# Patient Record
Sex: Female | Born: 1975 | Race: White | Hispanic: No | Marital: Married | Smoking: Never smoker
Health system: Southern US, Community
[De-identification: ages and names within clinical notes are randomized; demographics above are authoritative.]

## PROBLEM LIST (undated history)

## (undated) DIAGNOSIS — F32A Depression, unspecified: Secondary | ICD-10-CM

---

## 2001-03-19 HISTORY — PX: BREAST BIOPSY: SHX20

## 2015-08-09 DIAGNOSIS — L570 Actinic keratosis: Secondary | ICD-10-CM | POA: Diagnosis not present

## 2015-08-24 DIAGNOSIS — Z6826 Body mass index (BMI) 26.0-26.9, adult: Secondary | ICD-10-CM | POA: Diagnosis not present

## 2015-08-24 DIAGNOSIS — F321 Major depressive disorder, single episode, moderate: Secondary | ICD-10-CM | POA: Diagnosis not present

## 2015-08-24 DIAGNOSIS — R5383 Other fatigue: Secondary | ICD-10-CM | POA: Diagnosis not present

## 2015-08-24 DIAGNOSIS — Z1389 Encounter for screening for other disorder: Secondary | ICD-10-CM | POA: Diagnosis not present

## 2015-09-30 DIAGNOSIS — R002 Palpitations: Secondary | ICD-10-CM | POA: Diagnosis not present

## 2015-09-30 DIAGNOSIS — Z6825 Body mass index (BMI) 25.0-25.9, adult: Secondary | ICD-10-CM | POA: Diagnosis not present

## 2015-09-30 DIAGNOSIS — M545 Low back pain: Secondary | ICD-10-CM | POA: Diagnosis not present

## 2015-09-30 DIAGNOSIS — R319 Hematuria, unspecified: Secondary | ICD-10-CM | POA: Diagnosis not present

## 2015-10-10 DIAGNOSIS — D485 Neoplasm of uncertain behavior of skin: Secondary | ICD-10-CM | POA: Diagnosis not present

## 2015-10-10 DIAGNOSIS — D225 Melanocytic nevi of trunk: Secondary | ICD-10-CM | POA: Diagnosis not present

## 2016-01-10 DIAGNOSIS — Z6826 Body mass index (BMI) 26.0-26.9, adult: Secondary | ICD-10-CM | POA: Diagnosis not present

## 2016-01-10 DIAGNOSIS — F321 Major depressive disorder, single episode, moderate: Secondary | ICD-10-CM | POA: Diagnosis not present

## 2016-01-10 DIAGNOSIS — R5383 Other fatigue: Secondary | ICD-10-CM | POA: Diagnosis not present

## 2016-04-11 DIAGNOSIS — H6983 Other specified disorders of Eustachian tube, bilateral: Secondary | ICD-10-CM | POA: Diagnosis not present

## 2016-04-11 DIAGNOSIS — R5383 Other fatigue: Secondary | ICD-10-CM | POA: Diagnosis not present

## 2016-04-11 DIAGNOSIS — F321 Major depressive disorder, single episode, moderate: Secondary | ICD-10-CM | POA: Diagnosis not present

## 2016-04-11 DIAGNOSIS — Z6826 Body mass index (BMI) 26.0-26.9, adult: Secondary | ICD-10-CM | POA: Diagnosis not present

## 2016-04-11 DIAGNOSIS — Z01419 Encounter for gynecological examination (general) (routine) without abnormal findings: Secondary | ICD-10-CM | POA: Diagnosis not present

## 2017-01-17 DIAGNOSIS — R002 Palpitations: Secondary | ICD-10-CM | POA: Diagnosis not present

## 2017-01-17 DIAGNOSIS — R600 Localized edema: Secondary | ICD-10-CM | POA: Diagnosis not present

## 2017-01-17 DIAGNOSIS — Z6825 Body mass index (BMI) 25.0-25.9, adult: Secondary | ICD-10-CM | POA: Diagnosis not present

## 2017-01-17 DIAGNOSIS — R0600 Dyspnea, unspecified: Secondary | ICD-10-CM | POA: Diagnosis not present

## 2017-10-23 DIAGNOSIS — A609 Anogenital herpesviral infection, unspecified: Secondary | ICD-10-CM | POA: Diagnosis not present

## 2018-06-12 DIAGNOSIS — A6009 Herpesviral infection of other urogenital tract: Secondary | ICD-10-CM | POA: Diagnosis not present

## 2019-01-10 DIAGNOSIS — Z1159 Encounter for screening for other viral diseases: Secondary | ICD-10-CM | POA: Diagnosis not present

## 2019-03-23 DIAGNOSIS — F3289 Other specified depressive episodes: Secondary | ICD-10-CM | POA: Diagnosis not present

## 2019-03-23 DIAGNOSIS — F338 Other recurrent depressive disorders: Secondary | ICD-10-CM | POA: Diagnosis not present

## 2019-03-23 DIAGNOSIS — F411 Generalized anxiety disorder: Secondary | ICD-10-CM | POA: Diagnosis not present

## 2019-04-10 DIAGNOSIS — F411 Generalized anxiety disorder: Secondary | ICD-10-CM | POA: Diagnosis not present

## 2019-04-10 DIAGNOSIS — U071 COVID-19: Secondary | ICD-10-CM | POA: Diagnosis not present

## 2019-04-10 DIAGNOSIS — F338 Other recurrent depressive disorders: Secondary | ICD-10-CM | POA: Diagnosis not present

## 2019-04-29 DIAGNOSIS — F331 Major depressive disorder, recurrent, moderate: Secondary | ICD-10-CM | POA: Diagnosis not present

## 2019-04-29 DIAGNOSIS — Z6825 Body mass index (BMI) 25.0-25.9, adult: Secondary | ICD-10-CM | POA: Diagnosis not present

## 2019-04-29 DIAGNOSIS — R5383 Other fatigue: Secondary | ICD-10-CM | POA: Diagnosis not present

## 2019-07-09 DIAGNOSIS — R21 Rash and other nonspecific skin eruption: Secondary | ICD-10-CM | POA: Diagnosis not present

## 2019-08-07 DIAGNOSIS — R5383 Other fatigue: Secondary | ICD-10-CM | POA: Diagnosis not present

## 2019-08-07 DIAGNOSIS — F331 Major depressive disorder, recurrent, moderate: Secondary | ICD-10-CM | POA: Diagnosis not present

## 2019-08-07 DIAGNOSIS — Z6823 Body mass index (BMI) 23.0-23.9, adult: Secondary | ICD-10-CM | POA: Diagnosis not present

## 2020-06-10 ENCOUNTER — Other Ambulatory Visit: Payer: Self-pay

## 2020-06-10 ENCOUNTER — Emergency Department (HOSPITAL_COMMUNITY)
Admission: EM | Admit: 2020-06-10 | Discharge: 2020-06-10 | Disposition: A | Discharge status: Home / Self Care | Payer: BC Managed Care – PPO | Attending: Emergency Medicine | Admitting: Emergency Medicine

## 2020-06-10 ENCOUNTER — Encounter (HOSPITAL_COMMUNITY): Payer: Self-pay | Admitting: Emergency Medicine

## 2020-06-10 DIAGNOSIS — F430 Acute stress reaction: Secondary | ICD-10-CM | POA: Insufficient documentation

## 2020-06-10 HISTORY — DX: Depression, unspecified: F32.A

## 2020-06-10 MED ORDER — LORAZEPAM 1 MG PO TABS
1.0000 mg | ORAL_TABLET | Freq: Once | ORAL | Status: AC
  Administered 2020-06-10: 1 mg via ORAL
  Filled 2020-06-10: qty 1

## 2020-06-10 NOTE — Discharge Instructions (Signed)
We determined you were not an imminent threat to yourself or others  We came up with a plan to follow up with your primary care doctor in 48 hours to obtain a referral or assistance establishing care with a psychiatrist or a therapist as soon as possible.    Please take 300 mg wellbutrin daily, do not change or modify your dose unless directed by a clinician  Return immediately to the ER if you develop increased anxiety, depression, thoughts of wanting to die or plans to hurt yourself or others

## 2020-06-10 NOTE — ED Notes (Signed)
Pt uncooperative at this time. Security at bedside.

## 2020-06-10 NOTE — ED Triage Notes (Signed)
Pt here for a anxiety attack. Pt states she recently cut her wellbutrin in half because her boyfriend wanted her to not take medication. Pt states she also has stressors d/t her son being in the army and her second son is wanting to join the [employer]. Pt tearful in triage. States she sometimes thinks of killing her self and that if she did do it, it would be quick so that she would not suffer. She also states that she would never go through with it because she loves her children so much and it would hurt them.
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
## 2020-06-21 ENCOUNTER — Other Ambulatory Visit (HOSPITAL_COMMUNITY): Payer: Self-pay | Admitting: General Practice

## 2020-06-21 DIAGNOSIS — Z1231 Encounter for screening mammogram for malignant neoplasm of breast: Secondary | ICD-10-CM

## 2020-07-01 ENCOUNTER — Ambulatory Visit (HOSPITAL_COMMUNITY): Payer: BC Managed Care – PPO

## 2020-07-15 ENCOUNTER — Encounter (HOSPITAL_COMMUNITY): Payer: Self-pay

## 2020-07-15 ENCOUNTER — Ambulatory Visit (HOSPITAL_COMMUNITY)
Admission: RE | Admit: 2020-07-15 | Discharge: 2020-07-15 | Disposition: A | Discharge status: Home / Self Care | Payer: BC Managed Care – PPO | Source: Ambulatory Visit | Attending: General Practice | Admitting: General Practice

## 2020-07-15 ENCOUNTER — Other Ambulatory Visit: Payer: Self-pay

## 2020-07-15 DIAGNOSIS — Z1231 Encounter for screening mammogram for malignant neoplasm of breast: Secondary | ICD-10-CM | POA: Insufficient documentation

## 2020-07-19 ENCOUNTER — Other Ambulatory Visit (HOSPITAL_COMMUNITY): Payer: Self-pay | Admitting: General Practice

## 2020-07-20 ENCOUNTER — Other Ambulatory Visit (HOSPITAL_COMMUNITY): Payer: Self-pay | Admitting: General Practice

## 2020-07-25 ENCOUNTER — Other Ambulatory Visit (HOSPITAL_COMMUNITY): Payer: Self-pay | Admitting: General Practice

## 2020-07-25 DIAGNOSIS — R928 Other abnormal and inconclusive findings on diagnostic imaging of breast: Secondary | ICD-10-CM

## 2020-08-09 ENCOUNTER — Other Ambulatory Visit: Payer: Self-pay

## 2020-08-09 ENCOUNTER — Ambulatory Visit (HOSPITAL_COMMUNITY)
Admission: RE | Admit: 2020-08-09 | Discharge: 2020-08-09 | Disposition: A | Discharge status: Home / Self Care | Payer: BC Managed Care – PPO | Source: Ambulatory Visit | Attending: General Practice | Admitting: General Practice

## 2020-08-09 DIAGNOSIS — R928 Other abnormal and inconclusive findings on diagnostic imaging of breast: Secondary | ICD-10-CM

## 2021-06-24 IMAGING — MG MM DIGITAL DIAGNOSTIC UNILAT*R* W/ TOMO W/ CAD
4 series · 4 of 12 positions shown · non-contrast
Comparison: Screening mammogram dated 07/15/2020.

CLINICAL DATA: Screening recall for possible right breast mass. The
patient states she has had a benign biopsy of a palpable right
breast mass many years ago.

EXAM:
DIGITAL DIAGNOSTIC UNILATERAL RIGHT MAMMOGRAM WITH TOMOSYNTHESIS AND
CAD; ULTRASOUND RIGHT BREAST LIMITED
TECHNIQUE: Right digital diagnostic mammography and breast tomosynthesis was
performed. The images were evaluated with computer-aided detection.;
Targeted ultrasound examination of the right breast was performed

[R CC synth-2D]
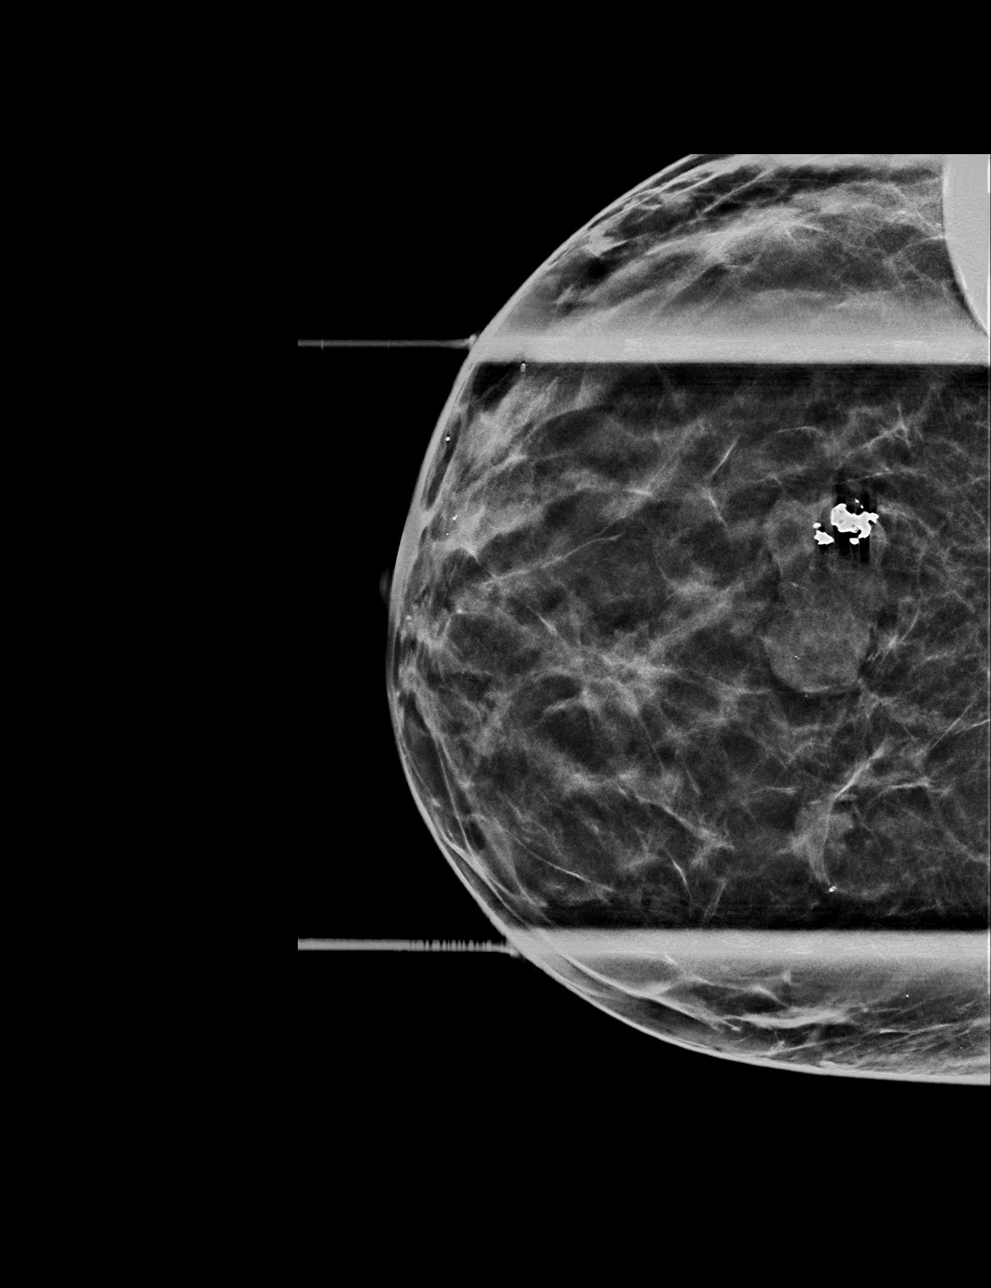

[R MLO synth-2D]
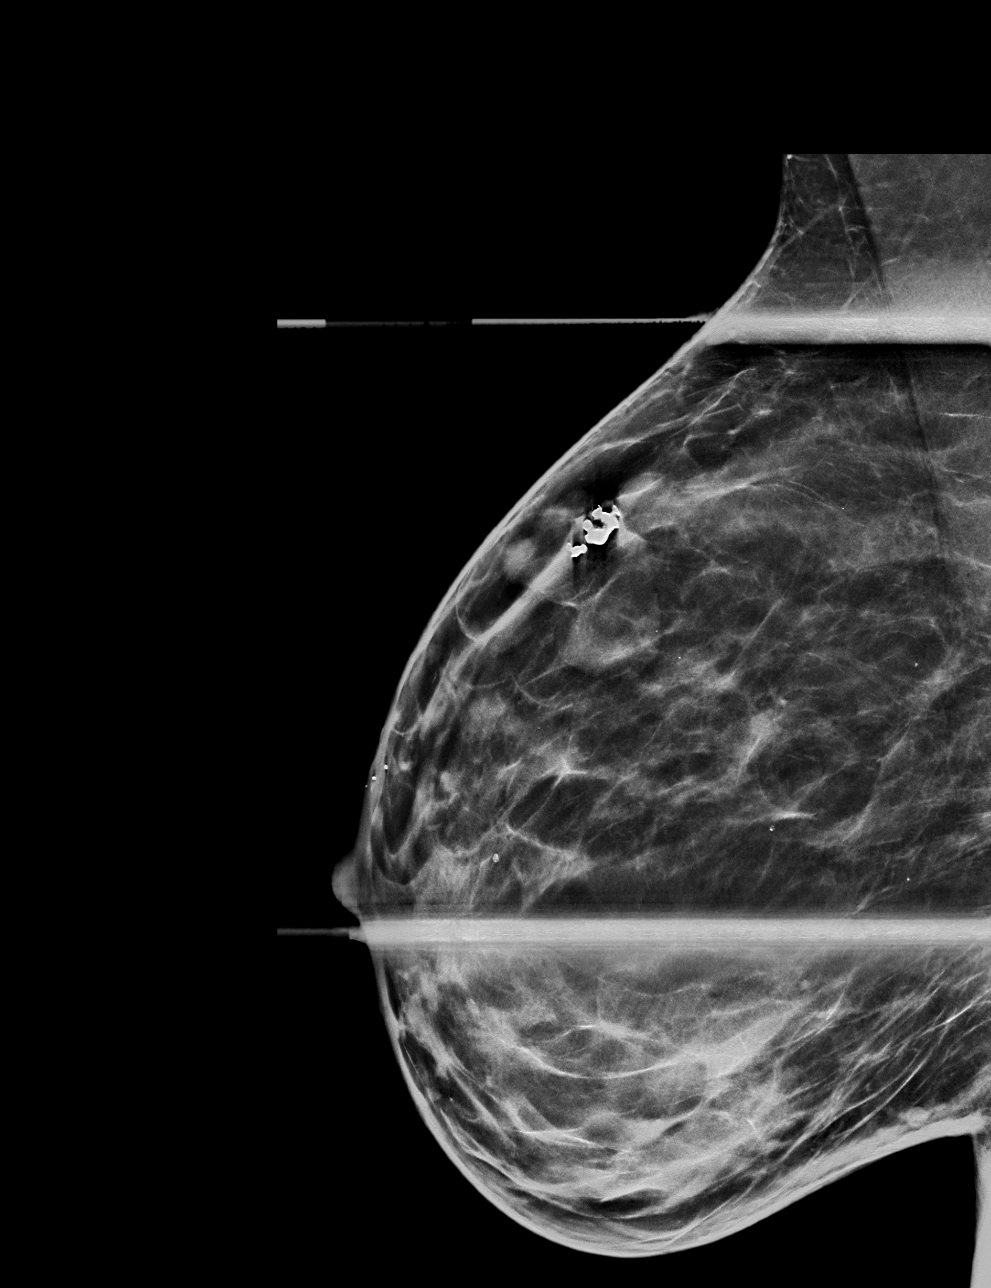

[R CC tomo · tomo slice 23/46.0]
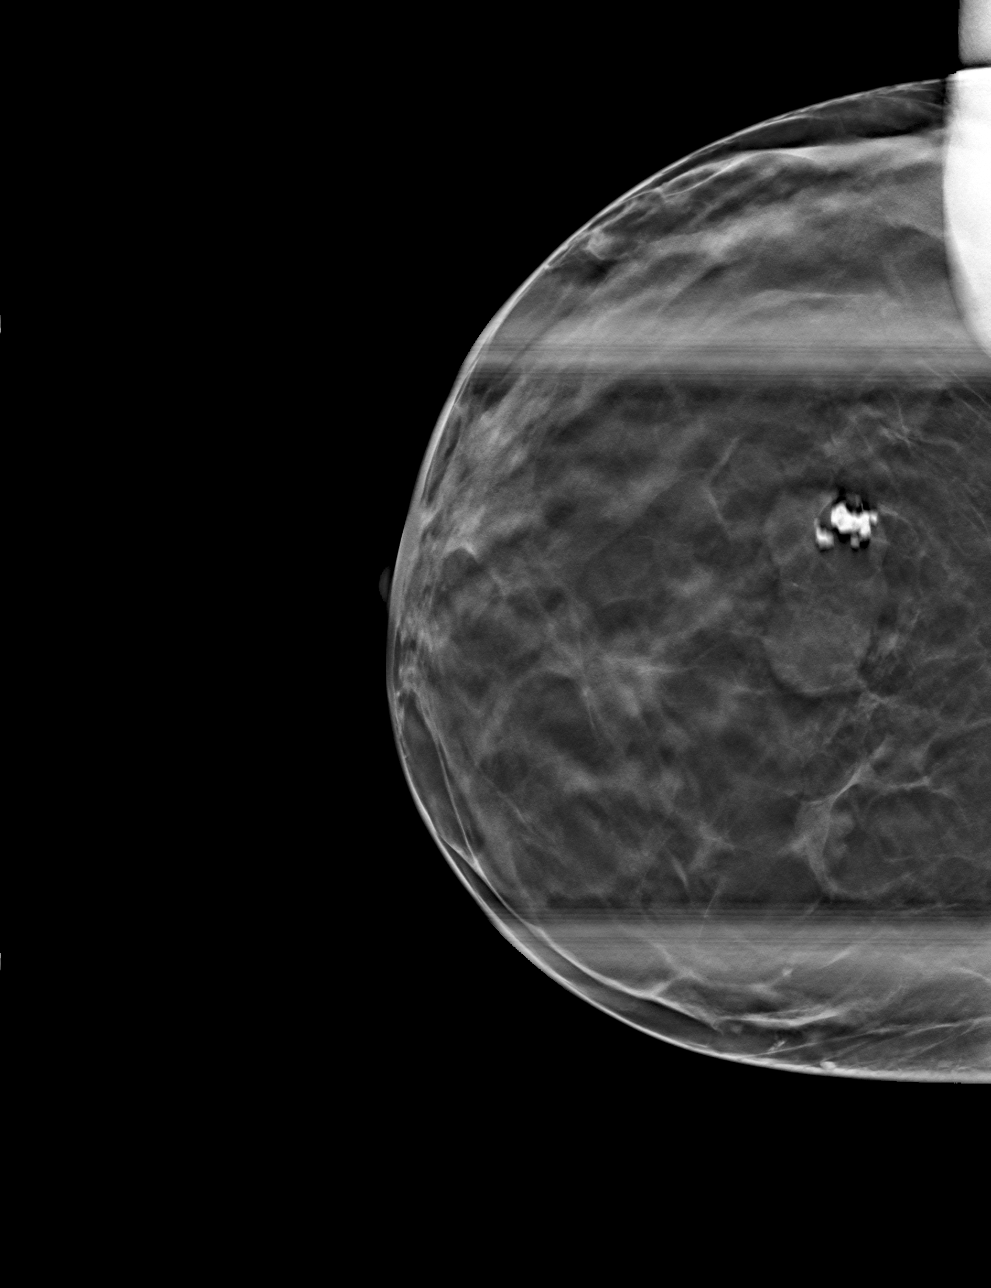

[R MLO tomo · tomo slice 27/53.0]
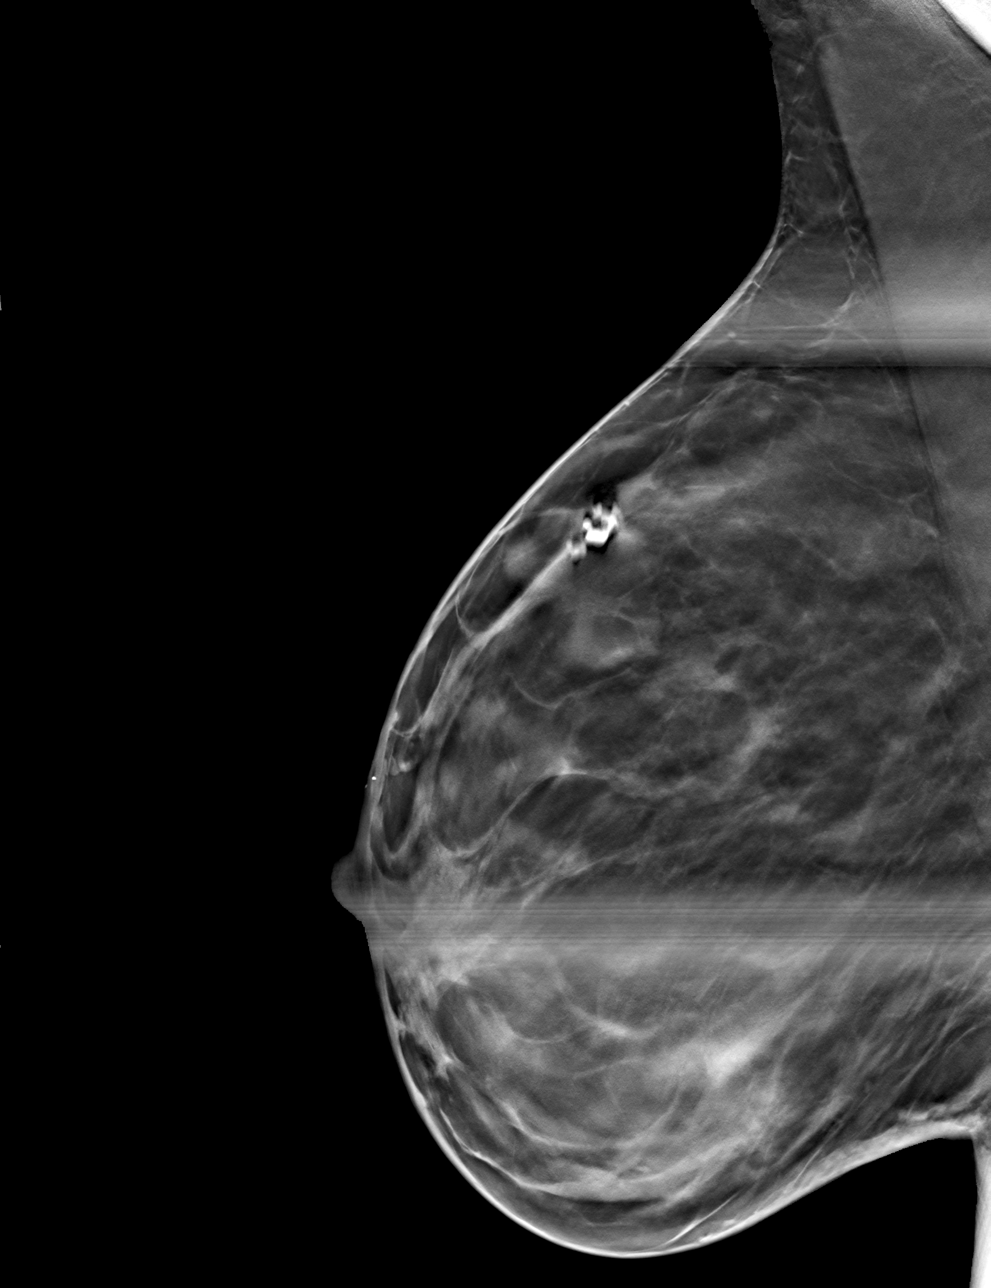

[4 of 12 positions shown; findings below may reference images not displayed]

ACR Breast Density Category b: There are scattered areas of
fibroglandular density.
FINDINGS: Spot compression tomograms were performed over the upper inner right
breast. There is an oval mass with obscured margins in the upper
inner right breast measuring approximately 1.2 cm. There is also an
oval circumscribed mass in the upper central right breast
superficial depth containing coarse popcorn type calcifications
measuring 3.4 cm, most consistent with an involuting fibroadenoma.

Targeted ultrasound of the right breast was performed. There is an
oval circumscribed hypoechoic mass in the right breast at 1 o'clock
1 cm from the nipple measuring 3.2 x 0.9 x 1.9 cm. This corresponds
well with the larger mass containing popcorn calcifications in the
upper central anterior right breast. There is an oval circumscribed
hypoechoic mass in the right breast at 2 o'clock 4 cm from nipple
measuring 1 x 0.5 x 1 cm. This corresponds well with the smaller
oval mass in the upper inner right breast measuring 1.2 cm and
demonstrates imaging features most suggestive of a fibroadenoma.
IMPRESSION: Probably benign mass in the right breast at the 2 o'clock position.

RECOMMENDATION:
Right breast ultrasound in 6 months.

I have discussed the findings and recommendations with the patient.
If applicable, a reminder letter will be sent to the patient
regarding the next appointment.

BI-RADS CATEGORY  3: Probably benign.

## 2021-06-24 IMAGING — US US BREAST*R* LIMITED INC AXILLA
1 series · 13 of 18 positions shown · non-contrast
Comparison: Screening mammogram dated 07/15/2020.

CLINICAL DATA: Screening recall for possible right breast mass. The
patient states she has had a benign biopsy of a palpable right
breast mass many years ago.

EXAM:
DIGITAL DIAGNOSTIC UNILATERAL RIGHT MAMMOGRAM WITH TOMOSYNTHESIS AND
CAD; ULTRASOUND RIGHT BREAST LIMITED
TECHNIQUE: Right digital diagnostic mammography and breast tomosynthesis was
performed. The images were evaluated with computer-aided detection.;
Targeted ultrasound examination of the right breast was performed

[Series 1: us breast*right* limited inc axilla · 0.07mm/px · 13 of 18 slices shown]
[im 1/18]
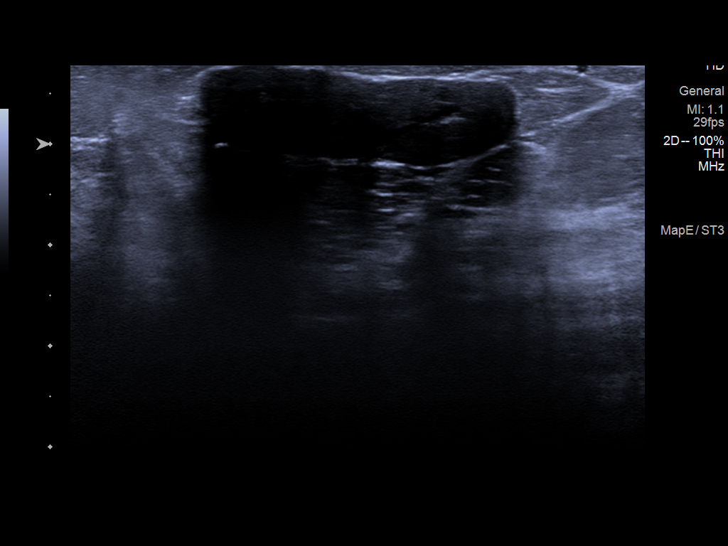
[im 3/18]
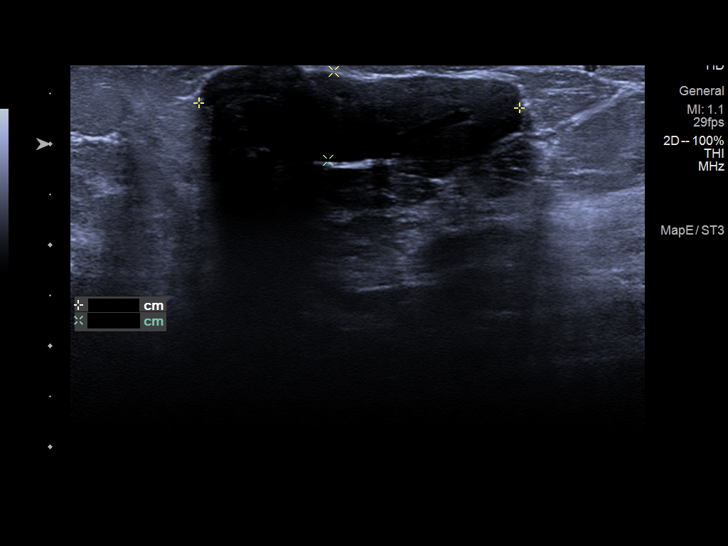
[im 4/18]
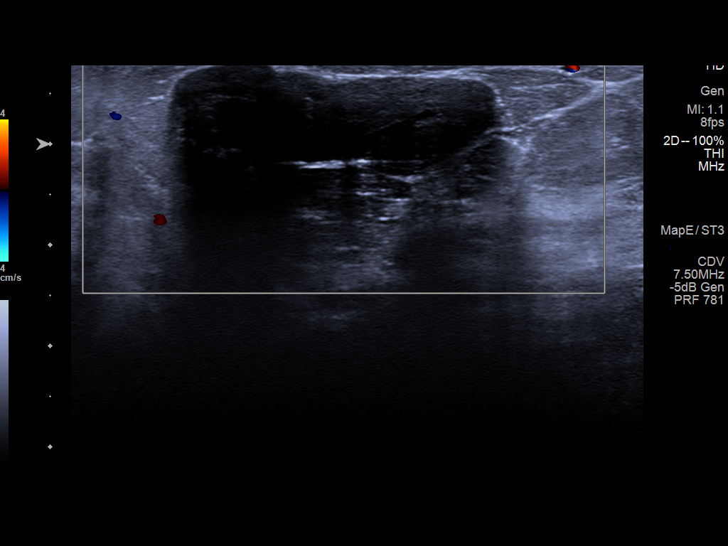
[im 5/18]
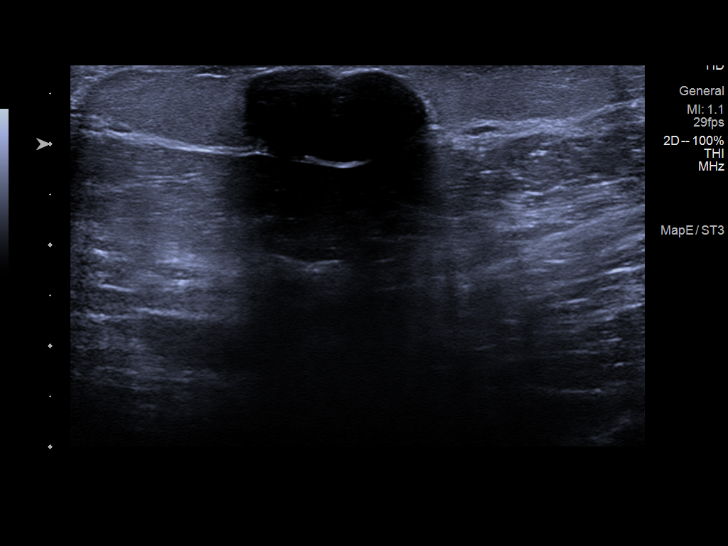
[im 7/18]
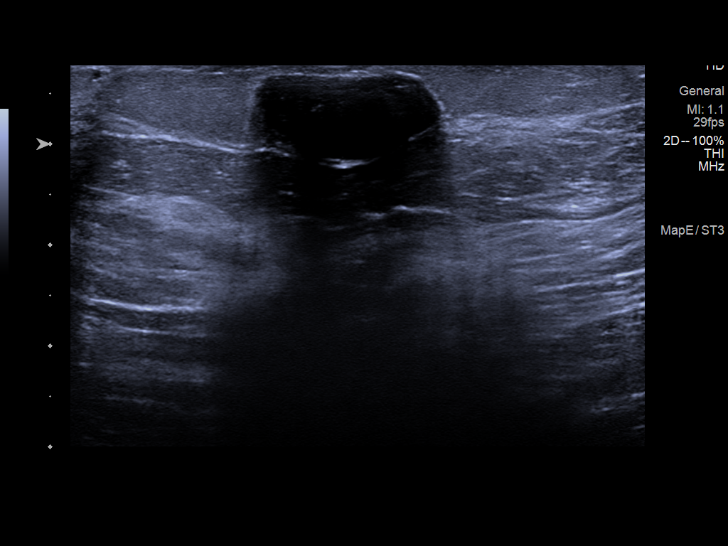
[im 8/18]
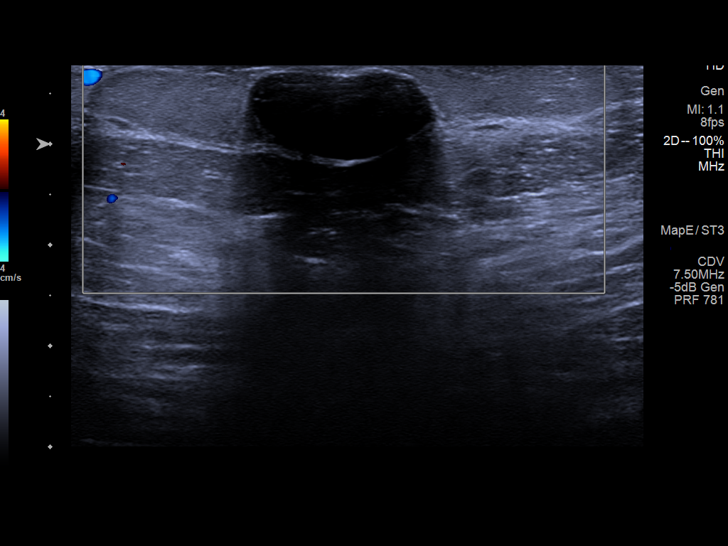
[im 10/18]
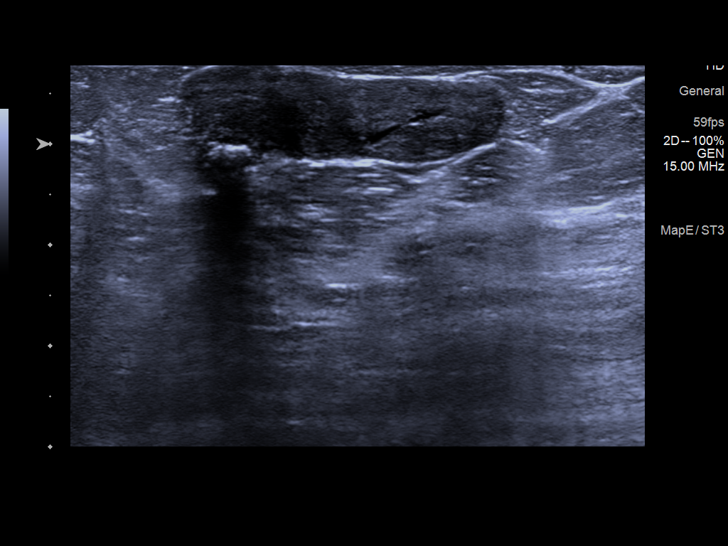
[im 11/18]
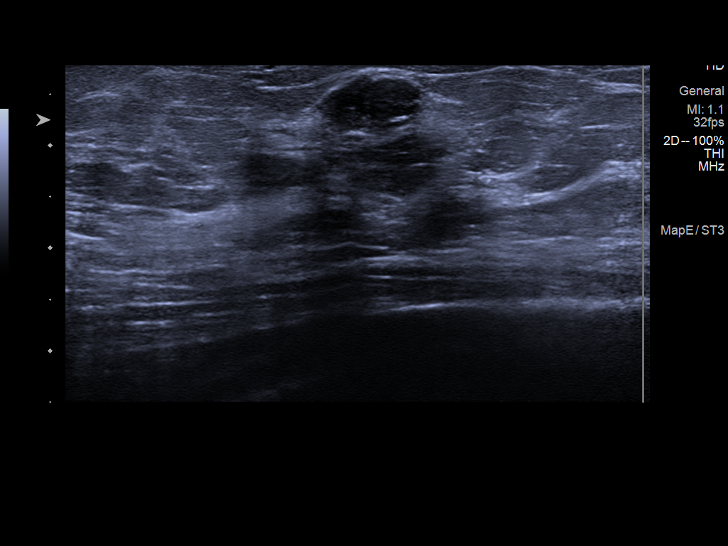
[im 12/18]
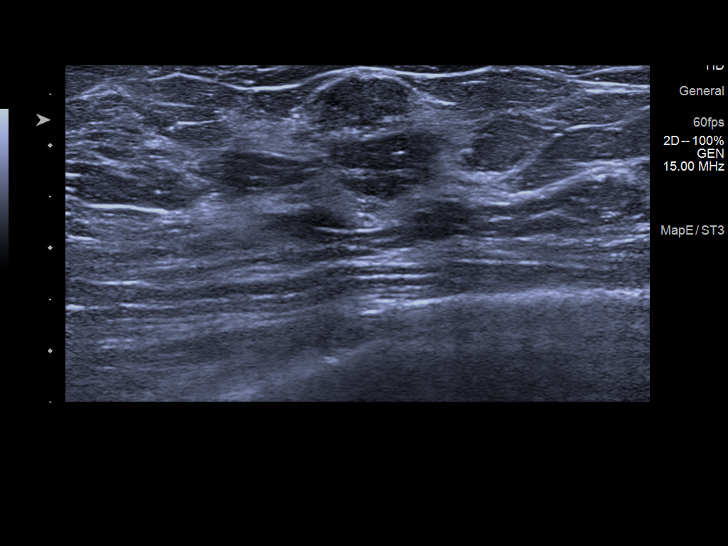
[im 14/18]
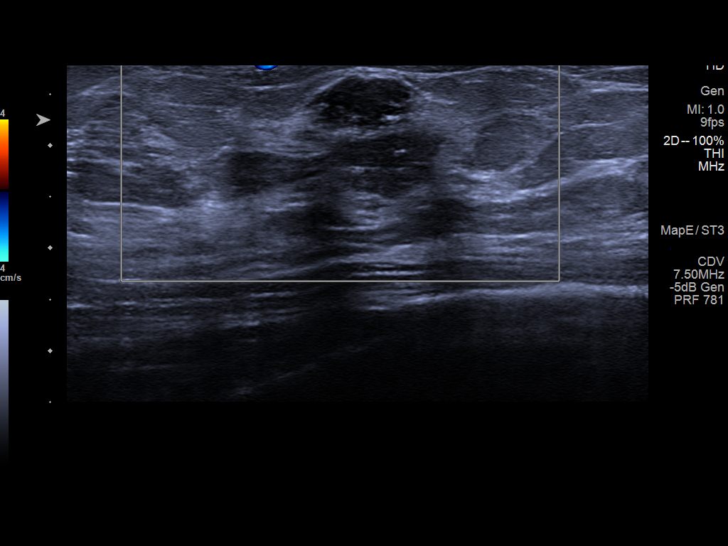
[im 15/18]
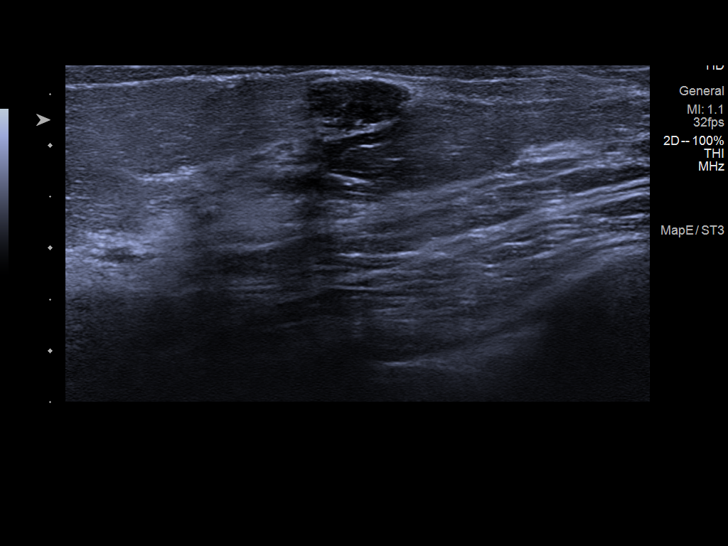
[im 16/18]
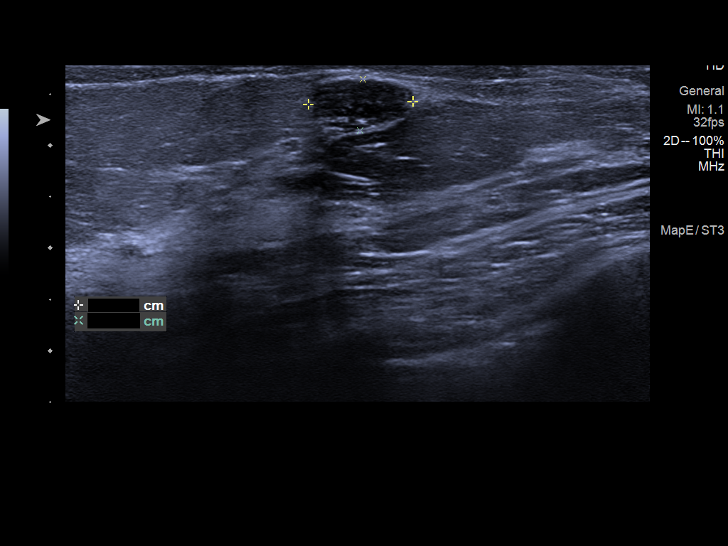
[im 18/18]
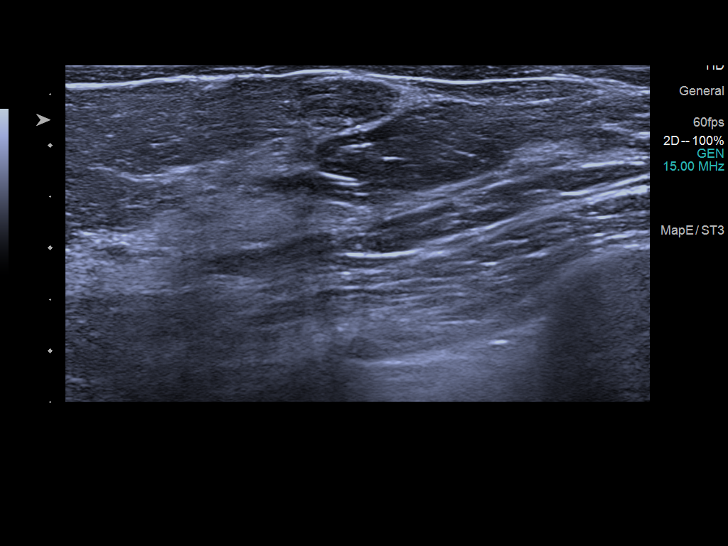

[13 of 18 positions shown; findings below may reference images not displayed]

ACR Breast Density Category b: There are scattered areas of
fibroglandular density.
FINDINGS: Spot compression tomograms were performed over the upper inner right
breast. There is an oval mass with obscured margins in the upper
inner right breast measuring approximately 1.2 cm. There is also an
oval circumscribed mass in the upper central right breast
superficial depth containing coarse popcorn type calcifications
measuring 3.4 cm, most consistent with an involuting fibroadenoma.

Targeted ultrasound of the right breast was performed. There is an
oval circumscribed hypoechoic mass in the right breast at 1 o'clock
1 cm from the nipple measuring 3.2 x 0.9 x 1.9 cm. This corresponds
well with the larger mass containing popcorn calcifications in the
upper central anterior right breast. There is an oval circumscribed
hypoechoic mass in the right breast at 2 o'clock 4 cm from nipple
measuring 1 x 0.5 x 1 cm. This corresponds well with the smaller
oval mass in the upper inner right breast measuring 1.2 cm and
demonstrates imaging features most suggestive of a fibroadenoma.
IMPRESSION: Probably benign mass in the right breast at the 2 o'clock position.

RECOMMENDATION:
Right breast ultrasound in 6 months.

I have discussed the findings and recommendations with the patient.
If applicable, a reminder letter will be sent to the patient
regarding the next appointment.

BI-RADS CATEGORY  3: Probably benign.

## 2021-09-12 ENCOUNTER — Other Ambulatory Visit: Payer: Self-pay | Admitting: Family Medicine

## 2021-09-12 DIAGNOSIS — Z1231 Encounter for screening mammogram for malignant neoplasm of breast: Secondary | ICD-10-CM

## 2021-09-18 ENCOUNTER — Other Ambulatory Visit: Payer: Self-pay | Admitting: Family Medicine

## 2021-09-18 DIAGNOSIS — N6310 Unspecified lump in the right breast, unspecified quadrant: Secondary | ICD-10-CM

## 2021-09-18 DIAGNOSIS — Z09 Encounter for follow-up examination after completed treatment for conditions other than malignant neoplasm: Secondary | ICD-10-CM
